# Patient Record
Sex: Female | Born: 2005 | Race: White | Hispanic: Yes | Marital: Single | Smoking: Never smoker
Health system: Southern US, Community
[De-identification: ages and names within clinical notes are randomized; demographics above are authoritative.]

---
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
## 2023-04-24 DIAGNOSIS — R200 Anesthesia of skin: Secondary | ICD-10-CM | POA: Diagnosis not present

## 2023-04-24 DIAGNOSIS — G43809 Other migraine, not intractable, without status migrainosus: Secondary | ICD-10-CM | POA: Diagnosis not present

## 2023-04-24 DIAGNOSIS — J4520 Mild intermittent asthma, uncomplicated: Secondary | ICD-10-CM | POA: Diagnosis not present

## 2023-04-24 DIAGNOSIS — R202 Paresthesia of skin: Secondary | ICD-10-CM | POA: Diagnosis not present

## 2023-04-24 DIAGNOSIS — Z0000 Encounter for general adult medical examination without abnormal findings: Secondary | ICD-10-CM | POA: Diagnosis not present

## 2023-07-20 DIAGNOSIS — G43409 Hemiplegic migraine, not intractable, without status migrainosus: Secondary | ICD-10-CM | POA: Diagnosis not present

## 2023-07-20 DIAGNOSIS — G8929 Other chronic pain: Secondary | ICD-10-CM | POA: Diagnosis not present

## 2023-07-20 DIAGNOSIS — M545 Low back pain, unspecified: Secondary | ICD-10-CM | POA: Diagnosis not present

## 2023-10-20 DIAGNOSIS — G43409 Hemiplegic migraine, not intractable, without status migrainosus: Secondary | ICD-10-CM | POA: Diagnosis not present

## 2023-10-20 DIAGNOSIS — M545 Low back pain, unspecified: Secondary | ICD-10-CM | POA: Diagnosis not present

## 2023-10-20 DIAGNOSIS — G8929 Other chronic pain: Secondary | ICD-10-CM | POA: Diagnosis not present

## 2023-11-02 DIAGNOSIS — G43109 Migraine with aura, not intractable, without status migrainosus: Secondary | ICD-10-CM | POA: Diagnosis not present

## 2023-11-02 DIAGNOSIS — G43909 Migraine, unspecified, not intractable, without status migrainosus: Secondary | ICD-10-CM | POA: Diagnosis not present

## 2023-11-02 DIAGNOSIS — R202 Paresthesia of skin: Secondary | ICD-10-CM | POA: Diagnosis not present

## 2023-11-02 DIAGNOSIS — Z79899 Other long term (current) drug therapy: Secondary | ICD-10-CM | POA: Diagnosis not present

## 2023-11-02 DIAGNOSIS — R519 Headache, unspecified: Secondary | ICD-10-CM | POA: Diagnosis not present

## 2023-11-12 DIAGNOSIS — G43409 Hemiplegic migraine, not intractable, without status migrainosus: Secondary | ICD-10-CM | POA: Diagnosis not present

## 2023-11-16 DIAGNOSIS — G43409 Hemiplegic migraine, not intractable, without status migrainosus: Secondary | ICD-10-CM | POA: Diagnosis not present

## 2024-04-26 DIAGNOSIS — J4520 Mild intermittent asthma, uncomplicated: Secondary | ICD-10-CM | POA: Diagnosis not present

## 2024-04-26 DIAGNOSIS — Z0000 Encounter for general adult medical examination without abnormal findings: Secondary | ICD-10-CM | POA: Diagnosis not present

## 2024-05-06 DIAGNOSIS — R202 Paresthesia of skin: Secondary | ICD-10-CM | POA: Diagnosis not present

## 2024-05-06 DIAGNOSIS — R200 Anesthesia of skin: Secondary | ICD-10-CM | POA: Diagnosis not present

## 2024-05-06 DIAGNOSIS — G43409 Hemiplegic migraine, not intractable, without status migrainosus: Secondary | ICD-10-CM | POA: Diagnosis not present

## 2024-05-27 DIAGNOSIS — M79604 Pain in right leg: Secondary | ICD-10-CM | POA: Diagnosis not present

## 2024-07-05 DIAGNOSIS — G43909 Migraine, unspecified, not intractable, without status migrainosus: Secondary | ICD-10-CM | POA: Diagnosis not present

## 2024-07-05 DIAGNOSIS — Z87891 Personal history of nicotine dependence: Secondary | ICD-10-CM | POA: Diagnosis not present

## 2024-07-18 DIAGNOSIS — G43709 Chronic migraine without aura, not intractable, without status migrainosus: Secondary | ICD-10-CM | POA: Diagnosis not present

## 2024-07-18 DIAGNOSIS — G43409 Hemiplegic migraine, not intractable, without status migrainosus: Secondary | ICD-10-CM | POA: Diagnosis not present

## 2024-07-18 DIAGNOSIS — M79604 Pain in right leg: Secondary | ICD-10-CM | POA: Diagnosis not present
